# Patient Record
Sex: Male | Born: 1960 | Race: White | Hispanic: No | Marital: Single | Smoking: Current every day smoker
Health system: Southern US, Community
[De-identification: ages and names within clinical notes are randomized; demographics above are authoritative.]

## PROBLEM LIST (undated history)

## (undated) DIAGNOSIS — Z9989 Dependence on other enabling machines and devices: Secondary | ICD-10-CM

## (undated) DIAGNOSIS — K219 Gastro-esophageal reflux disease without esophagitis: Secondary | ICD-10-CM

## (undated) DIAGNOSIS — G4733 Obstructive sleep apnea (adult) (pediatric): Secondary | ICD-10-CM

## (undated) DIAGNOSIS — E785 Hyperlipidemia, unspecified: Secondary | ICD-10-CM

## (undated) DIAGNOSIS — I10 Essential (primary) hypertension: Secondary | ICD-10-CM

## (undated) HISTORY — DX: Essential (primary) hypertension: I10

## (undated) HISTORY — DX: Hyperlipidemia, unspecified: E78.5

## (undated) HISTORY — DX: Obstructive sleep apnea (adult) (pediatric): G47.33

## (undated) HISTORY — DX: Gastro-esophageal reflux disease without esophagitis: K21.9

## (undated) HISTORY — DX: Dependence on other enabling machines and devices: Z99.89

---

## 1999-12-20 ENCOUNTER — Emergency Department (HOSPITAL_COMMUNITY): Admission: EM | Admit: 1999-12-20 | Discharge: 1999-12-20 | Payer: Self-pay | Admitting: Emergency Medicine

## 2000-06-04 ENCOUNTER — Emergency Department (HOSPITAL_COMMUNITY): Admission: EM | Admit: 2000-06-04 | Discharge: 2000-06-05 | Payer: Self-pay | Admitting: *Deleted

## 2002-12-11 ENCOUNTER — Emergency Department (HOSPITAL_COMMUNITY): Admission: EM | Admit: 2002-12-11 | Discharge: 2002-12-12 | Payer: Self-pay | Admitting: Emergency Medicine

## 2002-12-11 ENCOUNTER — Encounter: Payer: Self-pay | Admitting: Emergency Medicine

## 2002-12-12 ENCOUNTER — Encounter: Payer: Self-pay | Admitting: Emergency Medicine

## 2009-07-11 ENCOUNTER — Emergency Department (HOSPITAL_COMMUNITY): Admission: EM | Admit: 2009-07-11 | Discharge: 2009-07-11 | Payer: Self-pay | Admitting: Family Medicine

## 2010-08-20 ENCOUNTER — Other Ambulatory Visit: Payer: Self-pay | Admitting: Orthopedic Surgery

## 2010-08-20 DIAGNOSIS — M545 Low back pain, unspecified: Secondary | ICD-10-CM

## 2010-08-23 ENCOUNTER — Other Ambulatory Visit: Payer: Self-pay | Admitting: Orthopedic Surgery

## 2010-08-23 ENCOUNTER — Other Ambulatory Visit: Payer: Self-pay

## 2010-08-23 DIAGNOSIS — Z9889 Other specified postprocedural states: Secondary | ICD-10-CM

## 2010-08-23 DIAGNOSIS — Z139 Encounter for screening, unspecified: Secondary | ICD-10-CM

## 2010-08-25 ENCOUNTER — Ambulatory Visit
Admission: RE | Admit: 2010-08-25 | Discharge: 2010-08-25 | Disposition: A | Discharge status: Home / Self Care | Payer: Commercial Indemnity | Source: Ambulatory Visit | Attending: Orthopedic Surgery | Admitting: Orthopedic Surgery

## 2010-08-25 DIAGNOSIS — M545 Low back pain: Secondary | ICD-10-CM

## 2010-08-25 DIAGNOSIS — Z139 Encounter for screening, unspecified: Secondary | ICD-10-CM

## 2010-09-15 LAB — POCT I-STAT, CHEM 8
BUN: 8 mg/dL (ref 6–23)
Creatinine, Ser: 1.2 mg/dL (ref 0.4–1.5)
Hemoglobin: 16.7 g/dL (ref 13.0–17.0)
Potassium: 3.8 mEq/L (ref 3.5–5.1)
Sodium: 138 mEq/L (ref 135–145)

## 2012-07-10 ENCOUNTER — Ambulatory Visit: Payer: Self-pay | Admitting: Family Medicine

## 2012-07-10 VITALS — BP 126/90 | HR 114 | Temp 98.2°F | Resp 18 | Ht 72.0 in | Wt 283.2 lb

## 2012-07-10 DIAGNOSIS — Z0000 Encounter for general adult medical examination without abnormal findings: Secondary | ICD-10-CM

## 2012-07-10 DIAGNOSIS — Z0289 Encounter for other administrative examinations: Secondary | ICD-10-CM

## 2012-07-10 NOTE — Progress Notes (Signed)
Patient ID: [name] MRN: [id number], DOB: Apr 20, 1961 52 y.o. Date of Encounter: 07/10/2012, 9:30 AM  Primary Physician: No primary provider on file.  Chief Complaint: Physical (CPE)  HPI: 52 y.o. y/o male with history noted below here for CPE.  Doing well. No issues/complaints.  Review of Systems: Consitutional: No fever, chills, fatigue, night sweats, lymphadenopathy, or weight changes. Eyes: No visual changes, eye redness, or discharge. ENT/Mouth: Ears: No otalgia, tinnitus, hearing loss, discharge. Nose: No congestion, rhinorrhea, sinus pain, or epistaxis. Throat: No sore throat, post nasal drip, or teeth pain. Cardiovascular: No CP, palpitations, diaphoresis, DOE, edema, orthopnea, PND. Respiratory: No cough, hemoptysis, SOB, or wheezing. Gastrointestinal: No anorexia, dysphagia, reflux, pain, nausea, vomiting, hematemesis, diarrhea, constipation, BRBPR, or melena. Genitourinary: No dysuria, frequency, urgency, hematuria, incontinence, nocturia, decreased urinary stream, discharge, impotence, or testicular pain/masses. Musculoskeletal: No decreased ROM, myalgias, stiffness, joint swelling, or weakness. Skin: No rash, erythema, lesion changes, pain, warmth, jaundice, or pruritis. Neurological: No headache, dizziness, syncope, seizures, tremors, memory loss, coordination problems, or paresthesias. Psychological: No anxiety, depression, hallucinations, SI/HI. Endocrine: No fatigue, polydipsia, polyphagia, polyuria, or known diabetes. All other systems were reviewed and are otherwise negative.  Past Medical History  Diagnosis Date  . Hypertension   . Hyperlipidemia   . GERD (gastroesophageal reflux disease)      History reviewed. No pertinent past surgical history.  Home Meds:  Prior to Admission medications   Medication Sig Start Date End Date Taking? Authorizing Provider  benazepril (LOTENSIN) 20 MG tablet Take 20 mg by mouth daily.   Yes Historical Provider, MD    fish oil-omega-3 fatty acids 1000 MG capsule Take 2 g by mouth daily.   Yes Historical Provider, MD  omeprazole (PRILOSEC) 20 MG capsule Take 20 mg by mouth daily.   Yes Historical Provider, MD  pravastatin (PRAVACHOL) 20 MG tablet Take 20 mg by mouth daily.   Yes Historical Provider, MD  vitamin C (ASCORBIC ACID) 500 MG tablet Take 500 mg by mouth daily.   Yes Historical Provider, MD    Allergies: No Known Allergies  History   Social History  . Marital Status: Single    Spouse Name: N/A    Number of Children: N/A  . Years of Education: N/A   Occupational History  . Not on file.   Social History Main Topics  . Smoking status: Current Every Day Smoker -- 1.0 packs/day    Types: Cigarettes  . Smokeless tobacco: Not on file  . Alcohol Use: Not on file  . Drug Use: Not on file  . Sexually Active: Not on file   Other Topics Concern  . Not on file   Social History Narrative  . No narrative on file    Family History  Problem Relation Age of Onset  . Heart disease Father     Physical Exam: 128/88 Blood pressure 126/90, pulse 114, temperature 98.2 F (36.8 C), temperature source Oral, resp. rate 18, height 6' (1.829 m), weight 283 lb 3.2 oz (128.459 kg), SpO2 97.00%.  General: Well developed, well nourished, in no acute distress. HEENT: Normocephalic, atraumatic. Conjunctiva pink, sclera non-icteric. Pupils 2 mm constricting to 1 mm, round, regular, and equally reactive to light and accomodation. EOMI. Internal auditory canal clear. TMs with good cone of light and without pathology. Nasal mucosa pink. Nares are without discharge. No sinus tenderness. Oral mucosa pink. Dentition fair. Pharynx without exudate.   Neck: Supple. Trachea midline. No thyromegaly. Full ROM. No lymphadenopathy. Lungs: Clear to auscultation  bilaterally without wheezes, rales, or rhonchi. Breathing is of normal effort and unlabored. Cardiovascular: RRR with S1 S2. No murmurs, rubs, or gallops appreciated.  Distal pulses 2+ symmetrically. No carotid or abdominal bruits Abdomen: Soft, non-tender, non-distended with normoactive bowel sounds. No hepatosplenomegaly or masses. No rebound/guarding. No CVA tenderness. Without hernias.   Genitourinary:  circumcised male. No penile lesions. Testes descended bilaterally, and smooth without tenderness or masses.  Musculoskeletal: Full range of motion and 5/5 strength throughout. Without swelling, atrophy, tenderness, crepitus, or warmth. Extremities without clubbing, cyanosis, or edema. Calves supple. Skin: Warm and moist without erythema, ecchymosis, wounds, or rash. Neuro: A+Ox3. CN II-XII grossly intact. Moves all extremities spontaneously. Full sensation throughout. Normal gait. DTR 2+ throughout upper and lower extremities. Finger to nose intact. Psych:  Responds to questions appropriately with a normal affect.   Assessment/Plan:  52 y.o. y/o  male here for CPE  -  Signed, [name], MD 07/10/2012 9:30 AM

## 2013-07-02 ENCOUNTER — Ambulatory Visit: Payer: Self-pay | Admitting: Emergency Medicine

## 2013-07-02 VITALS — BP 132/82 | HR 106 | Temp 97.5°F | Resp 17 | Ht 74.0 in | Wt 301.0 lb

## 2013-07-02 DIAGNOSIS — Z0289 Encounter for other administrative examinations: Secondary | ICD-10-CM

## 2013-07-02 DIAGNOSIS — Z0000 Encounter for general adult medical examination without abnormal findings: Secondary | ICD-10-CM

## 2013-07-02 NOTE — Progress Notes (Signed)
   Subjective:    Patient ID: Noah Gates, male    DOB: 11/02/1960, 53 y.o.   MRN: [id number] This chart was scribed for Noah ChrisSteven Promise Weldin, MD by [name], Medical Scribe. This patient's care was started at 8:26 AM.  HPI HPI Comments: Noah Gates is a 53 y.o. male who presents to the [facility] for a DOT.   HTN- takes medication to keep under good control, sees Dr. Susa Raringavid Swain.  Abdomen- indigestion and heart burn from time to time. Takes over the counter medicine for both when needed  Eyes- must wear glasses to drive. Had Bell's Palsy 7 years ago that affected eyesight.   GU- traces of blood were found in urine at prior visit per Dr. Erling ConteSwain  Denies having sleep apnea.   Review of Systems All negative except for problems listed in HPI    Objective:   Physical Exam  CONSTITUTIONAL: Well developed/well nourished HEAD: Normocephalic/atraumatic, residual palsy related to Bell's Palsy EYES: EOMI/PERRL, residual left eye droop related to Bell's Palsy ENMT: Mucous membranes moist NECK: supple no meningeal signs SPINE:entire spine nontender CV: S1/S2 noted, no murmurs/rubs/gallops noted LUNGS: Lungs are clear to auscultation bilaterally, no apparent distress  ABDOMEN: soft, nontender, no rebound or guarding GU:no cva tenderness NEURO: Pt is awake/alert, moves all extremitiesx4 EXTREMITIES: pulses normal, full ROM SKIN: warm, color normal PSYCH: no abnormalities of mood noted    Assessment & Plan:  Patient qualifies for his DOT card I personally performed the services described in this documentation, which was scribed in my presence. The recorded information has been reviewed and is accurate.

## 2013-10-13 ENCOUNTER — Encounter: Payer: Self-pay | Admitting: *Deleted

## 2013-10-19 ENCOUNTER — Ambulatory Visit: Payer: Commercial Indemnity | Admitting: Cardiology

## 2013-12-08 ENCOUNTER — Telehealth: Payer: Self-pay | Admitting: Cardiology

## 2013-12-08 NOTE — Telephone Encounter (Signed)
Office notified via fax that patient cancelled his appointment on 10/19/13 with Dr. Antoine Poche.

## 2014-07-31 ENCOUNTER — Ambulatory Visit (INDEPENDENT_AMBULATORY_CARE_PROVIDER_SITE_OTHER): Payer: Self-pay | Admitting: Family Medicine

## 2014-07-31 VITALS — BP 135/85 | HR 93 | Temp 97.7°F | Resp 16 | Ht 73.0 in | Wt 315.0 lb

## 2014-07-31 DIAGNOSIS — I10 Essential (primary) hypertension: Secondary | ICD-10-CM | POA: Insufficient documentation

## 2014-07-31 DIAGNOSIS — Z9989 Dependence on other enabling machines and devices: Secondary | ICD-10-CM

## 2014-07-31 DIAGNOSIS — Z029 Encounter for administrative examinations, unspecified: Secondary | ICD-10-CM

## 2014-07-31 DIAGNOSIS — G4733 Obstructive sleep apnea (adult) (pediatric): Secondary | ICD-10-CM | POA: Insufficient documentation

## 2014-07-31 DIAGNOSIS — Z024 Encounter for examination for driving license: Secondary | ICD-10-CM

## 2014-07-31 NOTE — Patient Instructions (Signed)
YOU NEED TO GET A COMPLIANCE REPORT FOR YOUR CPAP MACHINE

## 2014-07-31 NOTE — Progress Notes (Signed)
 Chief Complaint:  Chief Complaint  Patient presents with  . Employment Physical    DOT    HPI: Noah Gates is a 54 y.o. male who is here for  DOT PE, PMH HTN, OSA He has OSA and is on CPAP and is compliant most days but not all days.  HE does not have insurance and he is too expensive, sleep study was done through Dr Earl Gala.  He is on Benzapril, last dose was this morning He does not have SEs.  He is compliant.  No CP, dizziness, numbness weakness or tingling.  No hx of MI/DM  BP Readings from Last 3 Encounters:  07/31/14 135/85  07/02/13 132/82  07/10/12 126/90      Past Medical History  Diagnosis Date  . Hypertension   . Hyperlipidemia   . GERD (gastroesophageal reflux disease)   . OSA on CPAP    History reviewed. No pertinent past surgical history. History   Social History  . Marital Status: Single    Spouse Name: N/A    Number of Children: N/A  . Years of Education: N/A   Social History Main Topics  . Smoking status: Current Every Day Smoker -- 1.00 packs/day for 22 years    Types: Cigarettes  . Smokeless tobacco: None  . Alcohol Use: None  . Drug Use: None  . Sexual Activity: None   Other Topics Concern  . None   Social History Narrative   Family History  Problem Relation Age of Onset  . Heart disease Father    No Known Allergies Prior to Admission medications   Medication Sig Start Date End Date Taking? Authorizing Provider  benazepril (LOTENSIN) 20 MG tablet Take 20 mg by mouth daily.   Yes Historical Provider, MD  fish oil-omega-3 fatty acids 1000 MG capsule Take 2 g by mouth daily.   Yes Historical Provider, MD  omeprazole (PRILOSEC) 20 MG capsule Take 20 mg by mouth daily.   Yes Historical Provider, MD  vitamin C (ASCORBIC ACID) 500 MG tablet Take 500 mg by mouth daily.   Yes Historical Provider, MD  pravastatin (PRAVACHOL) 20 MG tablet Take 20 mg by mouth daily.    Historical Provider, MD     ROS: The patient denies  fevers, chills, night sweats, unintentional weight loss, chest pain, palpitations, wheezing, dyspnea on exertion, nausea, vomiting, abdominal pain, dysuria, hematuria, melena, numbness, weakness, or tingling.   All other systems have been reviewed and were otherwise negative with the exception of those mentioned in the HPI and as above.    PHYSICAL EXAM: Filed Vitals:   07/31/14 0816  BP: 135/85  Pulse: 93  Temp: 97.7 F (36.5 C)  Resp: 16   Filed Vitals:   07/31/14 0816  Height:  (1.854 m)  Weight: 315 lb (142.883 kg)   Body mass index is 41.57 kg/(m^2).  General: Alert, no acute distress, obese white male HEENT:  Normocephalic, atraumatic, oropharynx patent. EOMI, PERRLA, TM normal  Cardiovascular:  Regular rate and rhythm, no rubs murmurs or gallops.  No Carotid bruits, radial pulse intact. No pedal edema.  Respiratory: Clear to auscultation bilaterally.  No wheezes, rales, or rhonchi.  No cyanosis, no use of accessory musculature GI: No organomegaly, abdomen is soft and non-tender, positive bowel sounds.  No masses. Skin: No rashes. Neurologic: Facial musculature symmetric. Psychiatric: Patient is appropriate throughout our interaction. Lymphatic: No cervical lymphadenopathy Musculoskeletal: Gait intact.  Neg inguinal hernia,  5/5 strength, 2/2 DTRS  LABS: Results for orders placed or performed during the hospital encounter of 07/11/09  I-STAT, chem 8  Result Value Ref Range   Sodium 138 135 - 145 mEq/L   Potassium 3.8 3.5 - 5.1 mEq/L   Chloride 104 96 - 112 mEq/L   BUN 8 6 - 23 mg/dL   Creatinine, Ser 1.2 0.4 - 1.5 mg/dL   Glucose, Bld 098140 (H) 70 - 99 mg/dL   Calcium, Ion 1.191.03 (L) 1.12 - 1.32 mmol/L   TCO2 30 0 - 100 mmol/L   Hemoglobin 16.7 13.0 - 17.0 g/dL   HCT 14.749.0 82.939.0 - 56.252.0 %     EKG/XRAY:   Primary read [facility]   ASSESSMENT/PLAN: Encounter Diagnoses  Name Primary?  . Essential hypertension   . OSA on CPAP   .  Encounter for Department of Transportation (DOT) examination for driving license renewal Yes   HTN WNL , OSA needs report, patient advised on how to get report Needs CPAP compliance  report. IF he turns it in  And complaint with CPAP he can get a 1 year card otherwise 3 months Advise to stop smoking F/u prn   Gross sideeffects, risk and benefits, and alternatives of medications d/w patient. Patient is aware that all medications have potential sideeffects and we are unable to predict every sideeffect or drug-drug interaction that may occur.  ,  PHUONG, DO 07/31/2014 9:17 AM

## 2014-10-11 ENCOUNTER — Ambulatory Visit (INDEPENDENT_AMBULATORY_CARE_PROVIDER_SITE_OTHER): Payer: Self-pay | Admitting: Emergency Medicine

## 2014-10-11 VITALS — BP 130/80 | HR 103 | Temp 98.1°F | Resp 16 | Ht 74.0 in | Wt 303.0 lb

## 2014-10-11 DIAGNOSIS — Z029 Encounter for administrative examinations, unspecified: Secondary | ICD-10-CM

## 2014-10-11 NOTE — Patient Instructions (Signed)

## 2014-10-11 NOTE — Progress Notes (Signed)
[facility] [address], [place] [address] [phone]  Date:  10/11/2014   Name:  Noah Gates   DOB:  09/17/1960   MRN:  [id number]  PCP:  No PCP Per Patient    Chief Complaint: Employment Physical   History of Present Illness:  Noah Gates is a 54 y.o. very pleasant male patient who presents with the following:  DOT OSA HBP   Patient Active Problem List   Diagnosis Date Noted  . Essential hypertension 07/31/2014  . OSA on CPAP 07/31/2014    Past Medical History  Diagnosis Date  . Hypertension   . Hyperlipidemia   . GERD (gastroesophageal reflux disease)   . OSA on CPAP     History reviewed. No pertinent past surgical history.  History  Substance Use Topics  . Smoking status: Current Every Day Smoker -- 1.00 packs/day for 22 years    Types: Cigarettes  . Smokeless tobacco: Not on file  . Alcohol Use: Not on file    Family History  Problem Relation Age of Onset  . Heart disease Father     No Known Allergies  Medication list has been reviewed and updated.  Current Outpatient Prescriptions on File Prior to Visit  Medication Sig Dispense Refill  . benazepril (LOTENSIN) 20 MG tablet Take 20 mg by mouth daily.    . fish oil-omega-3 fatty acids 1000 MG capsule Take 2 g by mouth daily.    Marland Kitchen. omeprazole (PRILOSEC) 20 MG capsule Take 20 mg by mouth daily.    . pravastatin (PRAVACHOL) 20 MG tablet Take 20 mg by mouth daily.    . vitamin C (ASCORBIC ACID) 500 MG tablet Take 500 mg by mouth daily.     No current facility-administered medications on file prior to visit.    Review of Systems:  As per HPI, otherwise negative.    Physical Examination: Filed Vitals:   10/11/14 0830  BP: 130/80  Pulse: 103  Temp: 98.1 F (36.7 C)  Resp: 16   Filed Vitals:   10/11/14 0830  Height: 6\' 2"  (1.88 m)  Weight: 303 lb (137.44 kg)   Body mass index is 38.89 kg/(m^2). Ideal Body Weight: Weight in (lb) to have BMI = 25:  194.3  GEN: WDWN, NAD, Non-toxic, A & O x 3 HEENT: Atraumatic, Normocephalic. Neck supple. No masses, No LAD. Ears and Nose: No external deformity. CV: RRR, No M/G/R. No JVD. No thrill. No extra heart sounds. PULM: CTA B, no wheezes, crackles, rhonchi. No retractions. No resp. distress. No accessory muscle use. ABD: S, NT, ND, +BS. No rebound. No HSM. EXTR: No c/c/e NEURO Normal gait.  PSYCH: Normally interactive. Conversant. Not depressed or anxious appearing.  Calm demeanor.    Assessment and Plan: DOT  HBP OSA Compliant with CPAP  Signed,  Phillips OdorJeffery Wanona Stare, MD

## 2015-08-23 ENCOUNTER — Other Ambulatory Visit: Payer: Self-pay | Admitting: Orthopedic Surgery

## 2015-08-23 DIAGNOSIS — M5416 Radiculopathy, lumbar region: Secondary | ICD-10-CM

## 2015-08-23 DIAGNOSIS — M545 Low back pain: Secondary | ICD-10-CM

## 2015-09-06 ENCOUNTER — Ambulatory Visit
Admission: RE | Admit: 2015-09-06 | Discharge: 2015-09-06 | Disposition: A | Discharge status: Home / Self Care | Payer: BLUE CROSS/BLUE SHIELD | Source: Ambulatory Visit | Attending: Orthopedic Surgery | Admitting: Orthopedic Surgery

## 2015-09-06 ENCOUNTER — Other Ambulatory Visit: Payer: Self-pay | Admitting: Orthopedic Surgery

## 2015-09-06 DIAGNOSIS — M545 Low back pain: Secondary | ICD-10-CM

## 2015-09-06 DIAGNOSIS — M5416 Radiculopathy, lumbar region: Secondary | ICD-10-CM

## 2015-09-06 DIAGNOSIS — Z77018 Contact with and (suspected) exposure to other hazardous metals: Secondary | ICD-10-CM

## 2016-04-11 IMAGING — MR MR LUMBAR SPINE W/O CM
4 of 5 series · 24 of 48 positions shown · non-contrast
Comparison: MRI lumbar spine 08/27/2010.

CLINICAL DATA: Chronic low back and bilateral lower extremity pain.
No known injury. Initial encounter.

EXAM:
MRI LUMBAR SPINE WITHOUT CONTRAST
TECHNIQUE: Multiplanar, multisequence MR imaging of the lumbar spine was
performed. No intravenous contrast was administered.

[Series 4: T1 · sagittal · 4.0mm · 0.55mm/px · 7 of 15 slices shown (1 of 2)]
[im 1/15]
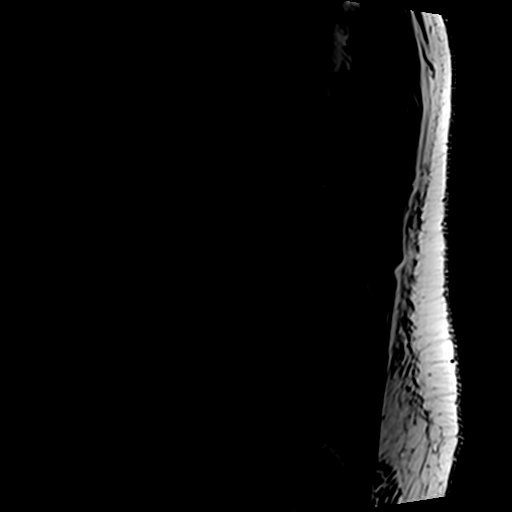
[im 3/15]
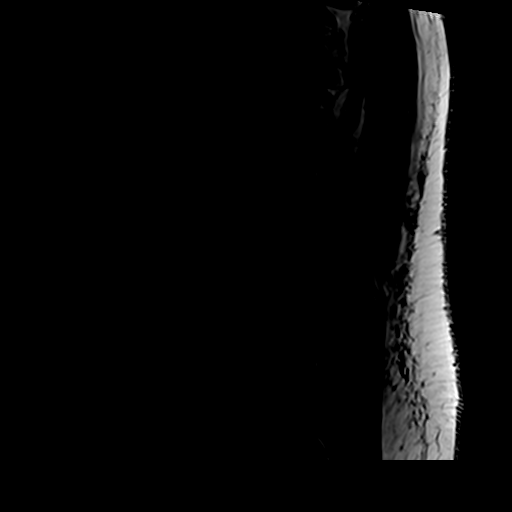
[im 5/15]
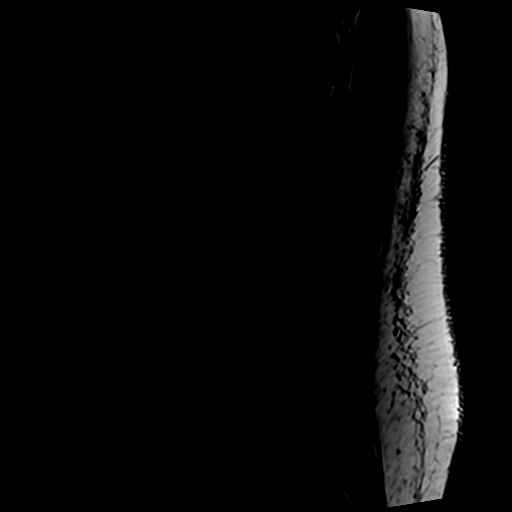
[im 8/15]
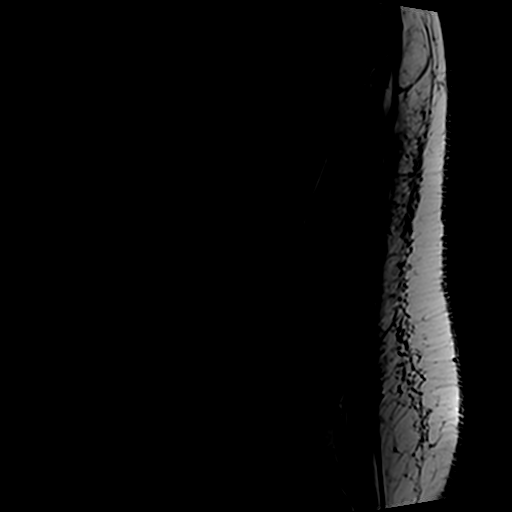
[im 10/15]
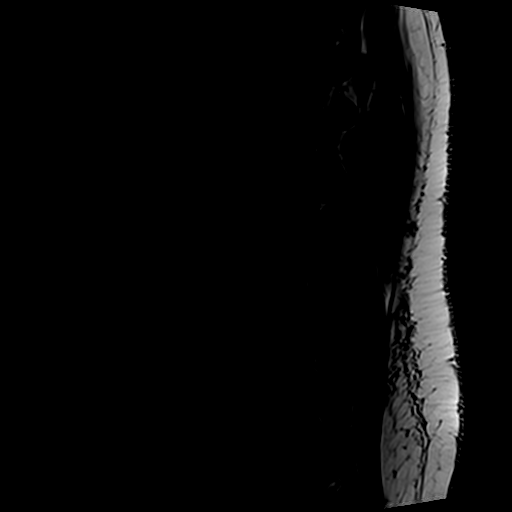
[im 12/15]
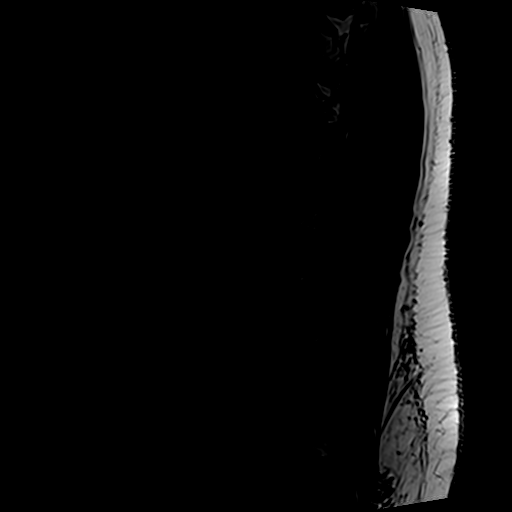
[im 15/15]
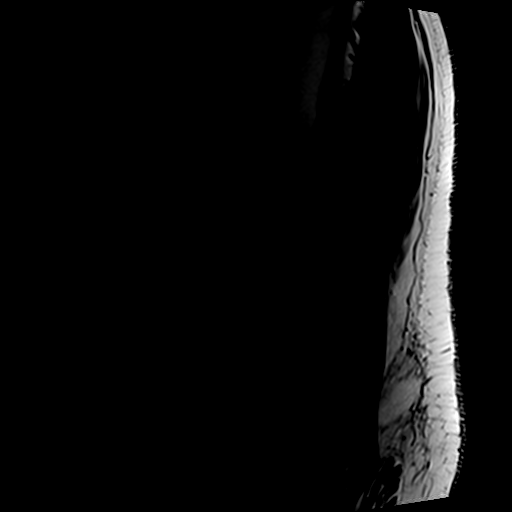

[Series 5: T2 · sagittal · 4.0mm · 0.55mm/px · 6 of 15 slices shown (1 of 2)]
[im 1/15]
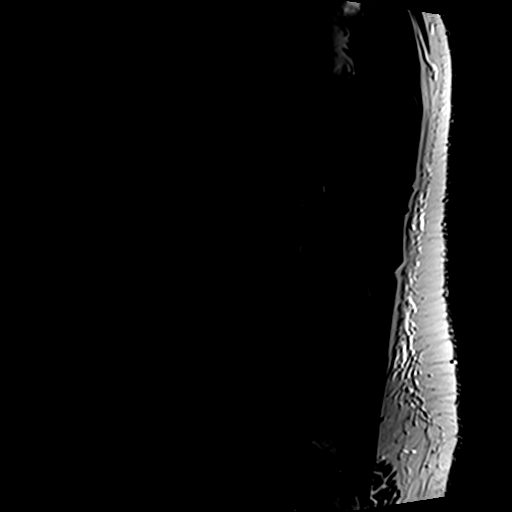
[im 3/15]
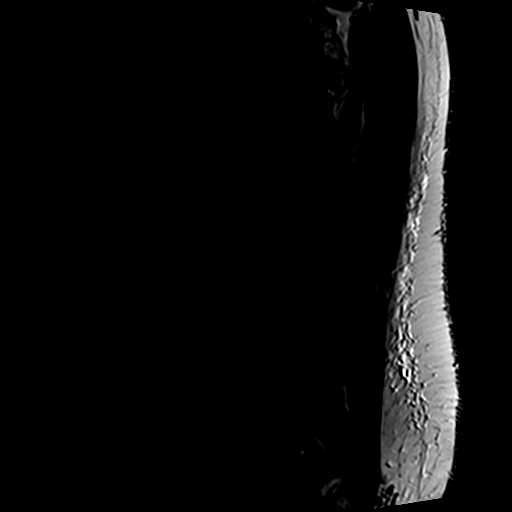
[im 6/15]
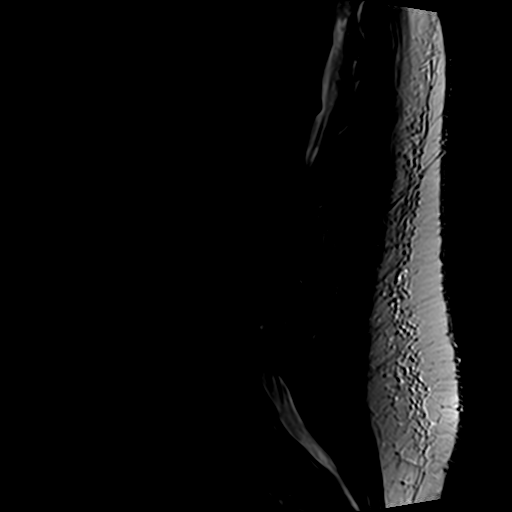
[im 9/15]
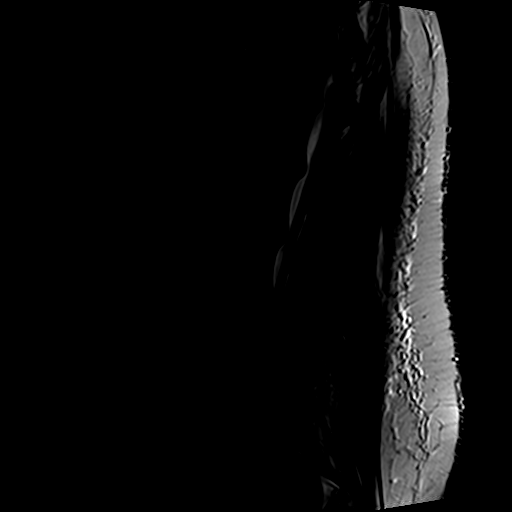
[im 12/15]
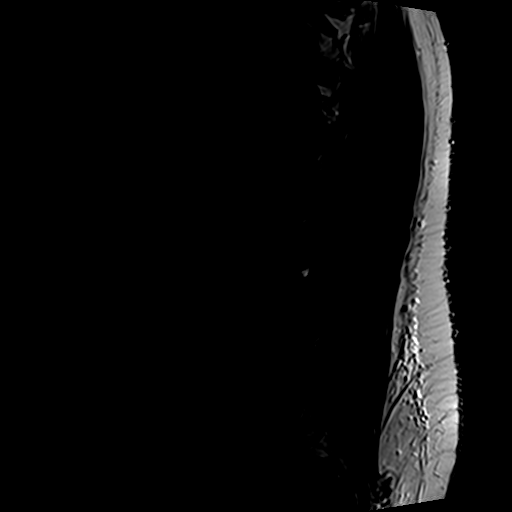
[im 15/15]
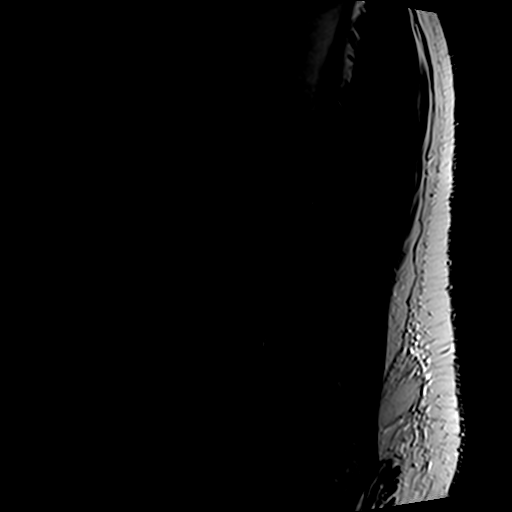

[Series 6: T1 · axial · 4.0mm · 0.35mm/px · z∈[-46,+80]mm · 3 of 34 slices shown (2 of 2)]
[im 6/34]
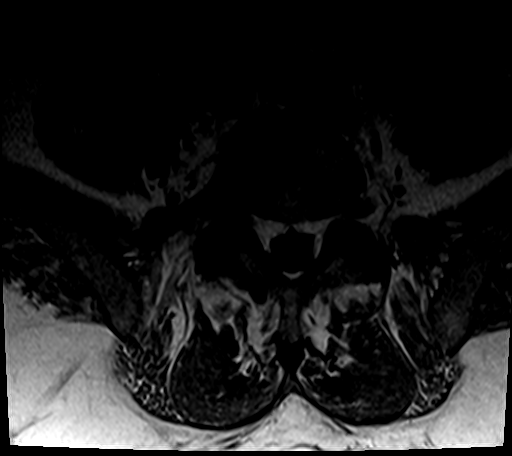
[im 18/34]
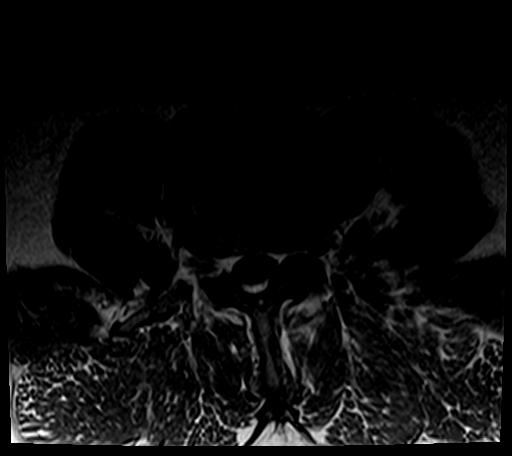
[im 28/34]
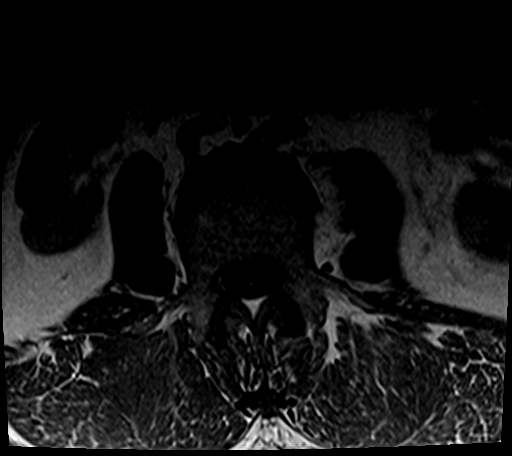

[Series 7: T2 · axial · 4.0mm · 0.70mm/px · z∈[-71,+111]mm · 8 of 34 slices shown (2 of 2)]
[im 1/34]
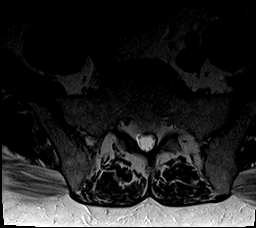
[im 6/34]
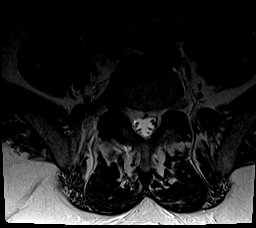
[im 11/34]
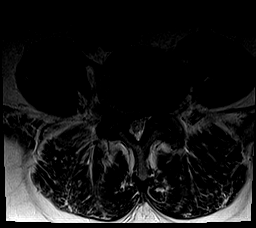
[im 16/34]
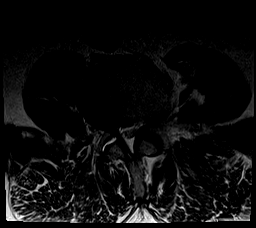
[im 18/34]
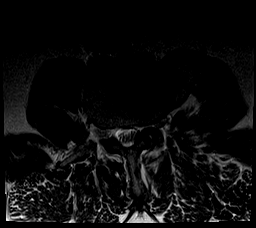
[im 23/34]
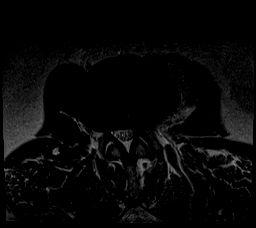
[im 28/34]
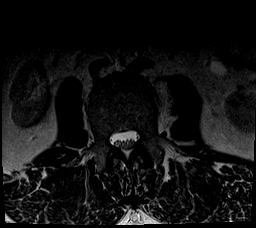
[im 34/34]
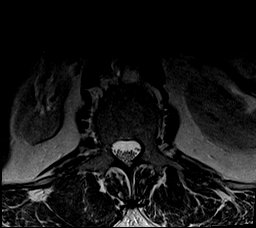

[24 of 48 positions shown; findings below may reference images not displayed]

FINDINGS: Vertebral body height is maintained. Minimal retrolisthesis L2 on L3
and L3 on L4 is identified. The patient has a congenitally narrow
central canal. No worrisome marrow lesion is identified. The conus
medullaris is normal in signal and position. Imaged intra-abdominal
contents are unremarkable.

The T11-12 and T12-L1 levels are imaged in the sagittal plane only.
Shallow disc bulging is seen at both levels but the central canal
and foramina appear open.

L1-2: Minimal disc bulge without central canal or foraminal
stenosis.

L2-3: Shallow disc bulge is somewhat more prominent to the left.
There is mild to moderate central canal and left foraminal
narrowing. The right foramen is open. Degenerative change shows some
progression since the prior MRI.

L3-4: The patient has a shallow broad-based disc bulge causing mild
to moderate central canal narrowing. Moderate to moderately severe
foraminal narrowing appears somewhat worse on the left but
unchanged.

L4-5: There is a disc bulge with endplate spur eccentrically
prominent to the right. Facet degenerative change is present. There
is moderately severe central canal stenosis and right worse than
left lateral recess narrowing. Moderately severe left and severe
right foraminal narrowing is identified. Spondylosis shows some
progression since the prior exam.

L5-S1: Right paracentral disc protrusion contacts the descending
right S1 root slightly deflecting it and deforming the thecal sac.
Moderate bilateral foraminal narrowing is identified. The appearance
is unchanged.
IMPRESSION: Some progression of spondylosis at L2-3 where there is mild to
moderate central canal and left foraminal narrowing.

No marked change in mild to moderate central canal moderate to
moderately severe foraminal narrowing, worse on the left, at L3-4.

Some progression of spondylosis at L4-5 where there is moderately
severe central canal narrowing and right worse than left lateral
recess and foraminal narrowing.

No change in a right paracentral protrusion at L5-S1 which slightly
deflects the descending S1 root without compressing it.

## 2016-08-26 ENCOUNTER — Ambulatory Visit (INDEPENDENT_AMBULATORY_CARE_PROVIDER_SITE_OTHER): Payer: Managed Care, Other (non HMO) | Admitting: Orthopaedic Surgery

## 2016-08-26 ENCOUNTER — Ambulatory Visit (INDEPENDENT_AMBULATORY_CARE_PROVIDER_SITE_OTHER): Payer: Self-pay

## 2016-08-26 ENCOUNTER — Encounter (INDEPENDENT_AMBULATORY_CARE_PROVIDER_SITE_OTHER): Payer: Self-pay | Admitting: Orthopaedic Surgery

## 2016-08-26 DIAGNOSIS — M25511 Pain in right shoulder: Secondary | ICD-10-CM | POA: Diagnosis not present

## 2016-08-26 DIAGNOSIS — G8929 Other chronic pain: Secondary | ICD-10-CM

## 2016-08-26 DIAGNOSIS — M25512 Pain in left shoulder: Secondary | ICD-10-CM | POA: Diagnosis not present

## 2016-08-26 MED ORDER — DICLOFENAC SODIUM 75 MG PO TBEC: 75.0000 mg | DELAYED_RELEASE_TABLET | Freq: Two times a day (BID) | ORAL | 2 refills | Status: AC
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
## 2016-09-05 ENCOUNTER — Encounter (INDEPENDENT_AMBULATORY_CARE_PROVIDER_SITE_OTHER): Payer: Self-pay | Admitting: Physical Medicine and Rehabilitation

## 2016-09-05 ENCOUNTER — Ambulatory Visit (INDEPENDENT_AMBULATORY_CARE_PROVIDER_SITE_OTHER): Payer: Self-pay

## 2016-09-05 ENCOUNTER — Ambulatory Visit (INDEPENDENT_AMBULATORY_CARE_PROVIDER_SITE_OTHER): Payer: Managed Care, Other (non HMO) | Admitting: Physical Medicine and Rehabilitation

## 2016-09-05 VITALS — BP 140/88 | HR 92

## 2016-09-05 DIAGNOSIS — M25511 Pain in right shoulder: Secondary | ICD-10-CM

## 2016-09-05 DIAGNOSIS — M25512 Pain in left shoulder: Secondary | ICD-10-CM

## 2016-09-05 DIAGNOSIS — G8929 Other chronic pain: Secondary | ICD-10-CM
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
## 2016-09-05 NOTE — Patient Instructions (Signed)

## 2016-09-08 MED ORDER — TRIAMCINOLONE ACETONIDE 40 MG/ML IJ SUSP
80.0000 mg | INTRAMUSCULAR | Status: AC | PRN
  Administered 2016-09-05: 80 mg via INTRA_ARTICULAR

## 2016-09-08 MED ORDER — TRIAMCINOLONE ACETONIDE 40 MG/ML IJ SUSP
40.0000 mg | INTRAMUSCULAR | Status: AC | PRN
  Administered 2016-09-05: 40 mg via INTRA_ARTICULAR

## 2016-09-08 MED ORDER — BUPIVACAINE HCL 0.5 % IJ SOLN
3.0000 mL | INTRAMUSCULAR | Status: AC | PRN
  Administered 2016-09-05: 3 mL via INTRA_ARTICULAR

## 2016-12-26 ENCOUNTER — Ambulatory Visit (INDEPENDENT_AMBULATORY_CARE_PROVIDER_SITE_OTHER): Payer: Managed Care, Other (non HMO) | Admitting: Orthopaedic Surgery

## 2016-12-26 ENCOUNTER — Encounter (INDEPENDENT_AMBULATORY_CARE_PROVIDER_SITE_OTHER): Payer: Self-pay | Admitting: Orthopaedic Surgery

## 2016-12-26 DIAGNOSIS — M19111 Post-traumatic osteoarthritis, right shoulder: Secondary | ICD-10-CM | POA: Diagnosis not present
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
## 2017-01-13 ENCOUNTER — Ambulatory Visit (INDEPENDENT_AMBULATORY_CARE_PROVIDER_SITE_OTHER): Payer: Self-pay

## 2017-01-13 ENCOUNTER — Ambulatory Visit (INDEPENDENT_AMBULATORY_CARE_PROVIDER_SITE_OTHER): Payer: Managed Care, Other (non HMO) | Admitting: Physical Medicine and Rehabilitation

## 2017-01-13 ENCOUNTER — Encounter (INDEPENDENT_AMBULATORY_CARE_PROVIDER_SITE_OTHER): Payer: Self-pay | Admitting: Physical Medicine and Rehabilitation

## 2017-01-13 DIAGNOSIS — M25511 Pain in right shoulder: Secondary | ICD-10-CM | POA: Diagnosis not present

## 2017-01-13 DIAGNOSIS — G8929 Other chronic pain: Secondary | ICD-10-CM | POA: Diagnosis not present

## 2017-01-13 DIAGNOSIS — M19111 Post-traumatic osteoarthritis, right shoulder: Secondary | ICD-10-CM | POA: Diagnosis not present

## 2017-01-13 NOTE — Progress Notes (Deleted)
Right shoulder pain for 2 months. Thinks he over did it while exercising. Felt pop. Wakes with pain at times due to pain when he turns over.

## 2017-01-13 NOTE — Patient Instructions (Signed)
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
## 2017-01-14 MED ORDER — BUPIVACAINE HCL 0.5 % IJ SOLN
3.0000 mL | INTRAMUSCULAR | Status: AC | PRN
  Administered 2017-01-13: 3 mL via INTRA_ARTICULAR

## 2017-01-14 MED ORDER — TRIAMCINOLONE ACETONIDE 40 MG/ML IJ SUSP
80.0000 mg | INTRAMUSCULAR | Status: AC | PRN
  Administered 2017-01-13: 80 mg via INTRA_ARTICULAR

## 2017-02-28 DEATH — deceased
